# Patient Record
Sex: Male | Born: 1965 | Race: White | Hispanic: No | Marital: Single
Health system: Southern US, Community
[De-identification: ages and names within clinical notes are randomized; demographics above are authoritative.]

---

## 2004-07-29 ENCOUNTER — Emergency Department: Payer: Self-pay | Admitting: Emergency Medicine

## 2005-03-03 ENCOUNTER — Other Ambulatory Visit: Payer: Self-pay

## 2005-03-04 ENCOUNTER — Observation Stay: Payer: Self-pay | Admitting: Internal Medicine

## 2005-04-18 ENCOUNTER — Emergency Department: Payer: Self-pay | Admitting: Emergency Medicine

## 2005-09-29 ENCOUNTER — Emergency Department: Payer: Self-pay | Admitting: Emergency Medicine

## 2006-02-18 ENCOUNTER — Emergency Department: Payer: Self-pay | Admitting: Emergency Medicine

## 2006-05-27 ENCOUNTER — Emergency Department: Payer: Self-pay | Admitting: Emergency Medicine

## 2007-10-25 ENCOUNTER — Inpatient Hospital Stay: Payer: Self-pay | Admitting: Internal Medicine

## 2007-10-25 ENCOUNTER — Other Ambulatory Visit: Payer: Self-pay

## 2007-10-26 ENCOUNTER — Other Ambulatory Visit: Payer: Self-pay

## 2008-06-16 ENCOUNTER — Inpatient Hospital Stay: Payer: Self-pay | Admitting: Internal Medicine

## 2010-02-19 ENCOUNTER — Emergency Department: Payer: Self-pay | Admitting: Emergency Medicine

## 2010-06-11 ENCOUNTER — Emergency Department: Payer: Self-pay | Admitting: Emergency Medicine

## 2011-09-16 ENCOUNTER — Emergency Department: Payer: Self-pay | Admitting: Unknown Physician Specialty

## 2011-09-17 LAB — CBC
HCT: 36.2 % — ABNORMAL LOW (ref 40.0–52.0)
MCH: 34 pg (ref 26.0–34.0)
MCHC: 34.5 g/dL (ref 32.0–36.0)
MCV: 99 fL (ref 80–100)
RBC: 3.67 10*6/uL — ABNORMAL LOW (ref 4.40–5.90)

## 2011-09-17 LAB — COMPREHENSIVE METABOLIC PANEL
Alkaline Phosphatase: 49 U/L — ABNORMAL LOW (ref 50–136)
Anion Gap: 15 (ref 7–16)
BUN: 6 mg/dL — ABNORMAL LOW (ref 7–18)
Calcium, Total: 8 mg/dL — ABNORMAL LOW (ref 8.5–10.1)
EGFR (African American): >60
Glucose: 106 mg/dL — ABNORMAL HIGH (ref 65–99)
Potassium: 3.5 mmol/L (ref 3.5–5.1)
SGOT(AST): 28 U/L (ref 15–37)
SGPT (ALT): 22 U/L

## 2011-09-17 LAB — LIPASE, BLOOD: Lipase: 973 U/L — ABNORMAL HIGH (ref 73–393)

## 2011-09-17 LAB — RAPID INFLUENZA A&B ANTIGENS

## 2011-09-17 LAB — ETHANOL: Ethanol %: 0.341 % (ref 0.000–0.080)

## 2011-11-26 ENCOUNTER — Emergency Department: Payer: Self-pay | Admitting: *Deleted

## 2011-11-26 LAB — CBC WITH DIFFERENTIAL/PLATELET
Basophil %: 0.8 %
Eosinophil %: 0.4 %
HCT: 40 % (ref 40.0–52.0)
HGB: 13.3 g/dL (ref 13.0–18.0)
Lymphocyte #: 1.3 10*3/uL (ref 1.0–3.6)
Lymphocyte %: 20.9 %
MCHC: 33.3 g/dL (ref 32.0–36.0)
MCV: 103 fL — ABNORMAL HIGH (ref 80–100)
Monocyte %: 6.9 %
Neutrophil #: 4.5 10*3/uL (ref 1.4–6.5)
Neutrophil %: 71 %
RBC: 3.9 10*6/uL — ABNORMAL LOW (ref 4.40–5.90)
RDW: 15.2 % — ABNORMAL HIGH (ref 11.5–14.5)
WBC: 6.3 10*3/uL (ref 3.8–10.6)

## 2011-11-26 LAB — COMPREHENSIVE METABOLIC PANEL
Albumin: 4.1 g/dL (ref 3.4–5.0)
Alkaline Phosphatase: 64 U/L (ref 50–136)
Anion Gap: 12 (ref 7–16)
BUN: 5 mg/dL — ABNORMAL LOW (ref 7–18)
Bilirubin,Total: 0.5 mg/dL (ref 0.2–1.0)
Calcium, Total: 8.7 mg/dL (ref 8.5–10.1)
Chloride: 102 mmol/L (ref 98–107)
Co2: 24 mmol/L (ref 21–32)
EGFR (Non-African Amer.): >60
Glucose: 99 mg/dL (ref 65–99)
SGOT(AST): 99 U/L — ABNORMAL HIGH (ref 15–37)
Sodium: 138 mmol/L (ref 136–145)
Total Protein: 7.2 g/dL (ref 6.4–8.2)

## 2011-11-26 LAB — ETHANOL
Ethanol %: 0.414 % (ref 0.000–0.080)
Ethanol: 414 mg/dL

## 2011-11-27 LAB — ETHANOL: Ethanol %: 0.062 % (ref 0.000–0.080)

## 2012-10-02 ENCOUNTER — Emergency Department: Payer: Self-pay | Admitting: Emergency Medicine

## 2012-10-02 LAB — BASIC METABOLIC PANEL
Calcium, Total: 8.9 mg/dL (ref 8.5–10.1)
Chloride: 95 mmol/L — ABNORMAL LOW (ref 98–107)
Creatinine: 0.77 mg/dL (ref 0.60–1.30)
EGFR (African American): >60
EGFR (Non-African Amer.): >60
Glucose: 71 mg/dL (ref 65–99)
Osmolality: 256 (ref 275–301)

## 2012-10-02 LAB — CBC WITH DIFFERENTIAL/PLATELET
Basophil %: 0.2 %
Eosinophil %: 0.1 %
HCT: 35 % — ABNORMAL LOW (ref 40.0–52.0)
HGB: 12.2 g/dL — ABNORMAL LOW (ref 13.0–18.0)
MCH: 33.6 pg (ref 26.0–34.0)
MCHC: 34.7 g/dL (ref 32.0–36.0)
MCV: 97 fL (ref 80–100)
RDW: 16.1 % — ABNORMAL HIGH (ref 11.5–14.5)
WBC: 5.5 10*3/uL (ref 3.8–10.6)

## 2012-10-06 ENCOUNTER — Ambulatory Visit: Payer: Self-pay | Admitting: Unknown Physician Specialty

## 2012-10-06 LAB — URINALYSIS, COMPLETE
Bacteria: NONE SEEN
Bilirubin,UR: NEGATIVE
Glucose,UR: NEGATIVE mg/dL (ref 0–75)
Ph: 6 (ref 4.5–8.0)
Protein: NEGATIVE
RBC,UR: <1 /HPF (ref 0–5)
Squamous Epithelial: NONE SEEN
WBC UR: <1 /HPF (ref 0–5)

## 2012-10-06 LAB — SYNOVIAL CELL COUNT + DIFF, W/ CRYSTALS
Crystals, Joint Fluid: NONE SEEN
Lymphocytes: 5 %
Nucleated Cell Count: 54537 /mm3
Other Cells BF: 0 %
Other Mononuclear Cells: 2 %

## 2012-10-06 LAB — CBC
HCT: 31.9 % — ABNORMAL LOW (ref 40.0–52.0)
MCH: 34.5 pg — ABNORMAL HIGH (ref 26.0–34.0)
MCHC: 35.4 g/dL (ref 32.0–36.0)
RBC: 3.27 10*6/uL — ABNORMAL LOW (ref 4.40–5.90)
RDW: 15.8 % — ABNORMAL HIGH (ref 11.5–14.5)
WBC: 6.1 10*3/uL (ref 3.8–10.6)

## 2012-10-06 LAB — COMPREHENSIVE METABOLIC PANEL
Albumin: 2.7 g/dL — ABNORMAL LOW (ref 3.4–5.0)
Alkaline Phosphatase: 78 U/L (ref 50–136)
Anion Gap: 9 (ref 7–16)
Calcium, Total: 8.6 mg/dL (ref 8.5–10.1)
Chloride: 96 mmol/L — ABNORMAL LOW (ref 98–107)
EGFR (African American): >60
EGFR (Non-African Amer.): >60
Potassium: 3.6 mmol/L (ref 3.5–5.1)
SGOT(AST): 90 U/L — ABNORMAL HIGH (ref 15–37)

## 2012-10-07 ENCOUNTER — Inpatient Hospital Stay: Payer: Self-pay | Admitting: Internal Medicine

## 2012-10-07 LAB — CULTURE, BLOOD (SINGLE)

## 2012-10-08 LAB — CREATININE, SERUM: Creatinine: 0.91 mg/dL (ref 0.60–1.30)

## 2012-10-10 LAB — BODY FLUID CULTURE

## 2012-10-12 LAB — CULTURE, BLOOD (SINGLE)

## 2013-01-01 ENCOUNTER — Inpatient Hospital Stay: Payer: Self-pay | Admitting: Internal Medicine

## 2013-01-01 LAB — URINALYSIS, COMPLETE
Bilirubin,UR: NEGATIVE
Leukocyte Esterase: NEGATIVE
Nitrite: NEGATIVE
Ph: 7 (ref 4.5–8.0)
Protein: 100
RBC,UR: 5 /HPF (ref 0–5)
Specific Gravity: 1.014 (ref 1.003–1.030)
WBC UR: <1 /HPF (ref 0–5)

## 2013-01-01 LAB — CBC
HCT: 39 % — ABNORMAL LOW (ref 40.0–52.0)
MCH: 34.8 pg — ABNORMAL HIGH (ref 26.0–34.0)
MCHC: 34.4 g/dL (ref 32.0–36.0)
MCV: 101 fL — ABNORMAL HIGH (ref 80–100)
Platelet: 178 10*3/uL (ref 150–440)
RBC: 3.86 10*6/uL — ABNORMAL LOW (ref 4.40–5.90)
WBC: 7.3 10*3/uL (ref 3.8–10.6)

## 2013-01-01 LAB — ETHANOL
Ethanol %: <0.003 % (ref 0.000–0.080)
Ethanol: <3 mg/dL

## 2013-01-01 LAB — COMPREHENSIVE METABOLIC PANEL
Albumin: 4.3 g/dL (ref 3.4–5.0)
Alkaline Phosphatase: 72 U/L (ref 50–136)
BUN: 9 mg/dL (ref 7–18)
Chloride: 101 mmol/L (ref 98–107)
Co2: 20 mmol/L — ABNORMAL LOW (ref 21–32)
Creatinine: 1.64 mg/dL — ABNORMAL HIGH (ref 0.60–1.30)
EGFR (African American): 56 — ABNORMAL LOW
Osmolality: 281 (ref 275–301)
Potassium: 3.7 mmol/L (ref 3.5–5.1)
SGOT(AST): 91 U/L — ABNORMAL HIGH (ref 15–37)
SGPT (ALT): 75 U/L (ref 12–78)
Total Protein: 7.6 g/dL (ref 6.4–8.2)

## 2013-01-01 LAB — TROPONIN I: Troponin-I: <0.02 ng/mL

## 2013-01-02 LAB — CBC WITH DIFFERENTIAL/PLATELET
Basophil #: 0 10*3/uL (ref 0.0–0.1)
Lymphocyte #: 0.7 10*3/uL — ABNORMAL LOW (ref 1.0–3.6)
MCHC: 34.8 g/dL (ref 32.0–36.0)
MCV: 100 fL (ref 80–100)
Monocyte #: 0.4 x10 3/mm (ref 0.2–1.0)
Neutrophil %: 76.3 %
RBC: 3.54 10*6/uL — ABNORMAL LOW (ref 4.40–5.90)
RDW: 15.8 % — ABNORMAL HIGH (ref 11.5–14.5)

## 2013-01-02 LAB — BASIC METABOLIC PANEL
Anion Gap: 7 (ref 7–16)
BUN: 7 mg/dL (ref 7–18)
Calcium, Total: 8.3 mg/dL — ABNORMAL LOW (ref 8.5–10.1)
Chloride: 104 mmol/L (ref 98–107)
Co2: 26 mmol/L (ref 21–32)
Creatinine: 0.86 mg/dL (ref 0.60–1.30)
EGFR (African American): >60
EGFR (Non-African Amer.): >60
Osmolality: 271 (ref 275–301)
Potassium: 3.1 mmol/L — ABNORMAL LOW (ref 3.5–5.1)

## 2013-01-02 LAB — HEMOGLOBIN A1C: Hemoglobin A1C: 5.3 % (ref 4.2–6.3)

## 2013-03-21 ENCOUNTER — Emergency Department: Payer: Self-pay | Admitting: Internal Medicine

## 2013-03-21 LAB — COMPREHENSIVE METABOLIC PANEL
Albumin: 4.4 g/dL (ref 3.4–5.0)
BUN: 9 mg/dL (ref 7–18)
Calcium, Total: 9.6 mg/dL (ref 8.5–10.1)
Chloride: 99 mmol/L (ref 98–107)
Co2: 17 mmol/L — ABNORMAL LOW (ref 21–32)
Creatinine: 1.26 mg/dL (ref 0.60–1.30)
EGFR (African American): >60
EGFR (Non-African Amer.): >60
Glucose: 121 mg/dL — ABNORMAL HIGH (ref 65–99)
Osmolality: 270 (ref 275–301)
SGOT(AST): 42 U/L — ABNORMAL HIGH (ref 15–37)
Total Protein: 7.4 g/dL (ref 6.4–8.2)

## 2013-03-21 LAB — CBC
HCT: 40.9 % (ref 40.0–52.0)
HGB: 14.3 g/dL (ref 13.0–18.0)
Platelet: 237 10*3/uL (ref 150–440)
RBC: 4.09 10*6/uL — ABNORMAL LOW (ref 4.40–5.90)
WBC: 7 10*3/uL (ref 3.8–10.6)

## 2013-03-21 LAB — CK TOTAL AND CKMB (NOT AT ARMC): CK-MB: 3.7 ng/mL — ABNORMAL HIGH (ref 0.5–3.6)

## 2013-07-06 IMAGING — CT CT ABD-PELV W/ CM
1 of 2 series · 14 of 32 positions shown, 18 images · non-contrast
Comparison: none

REASON FOR EXAM: (1) swollen right leg / high ESR; (2) swollen R. leg-
r/o abscess / tumor obstru
COMMENTS:

[Series 2: 3mm soft tissue · axial · 0.67mm/px · z∈[-234,+186]mm · 14 of 160 slices shown, 18 images]
[im 13/160  soft-tissue]
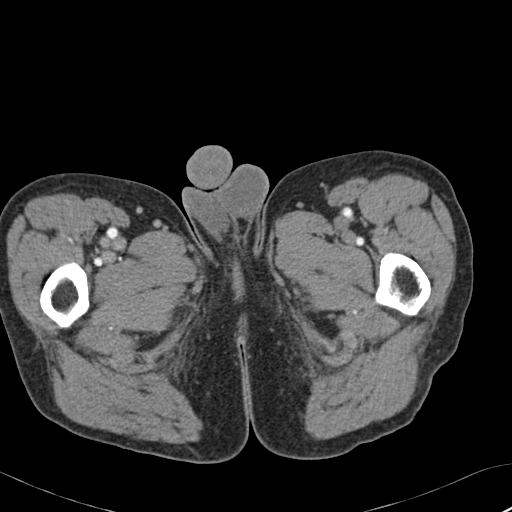
[im 13/160  bone]
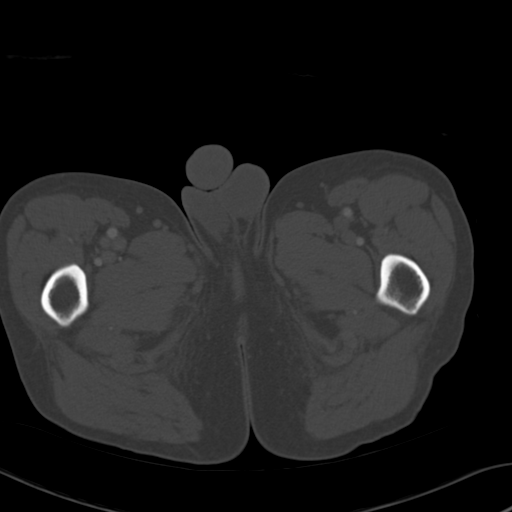
[im 26/160  soft-tissue]
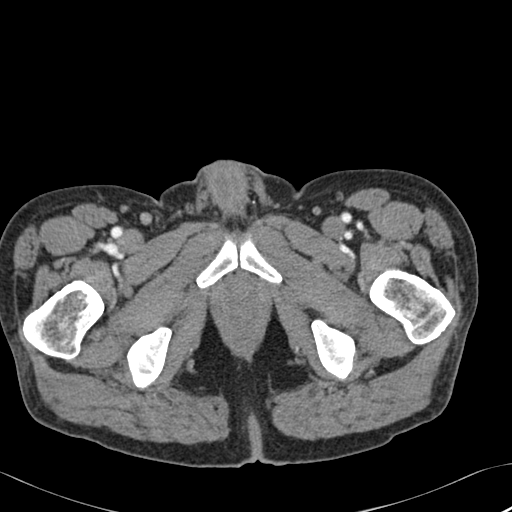
[im 39/160  soft-tissue]
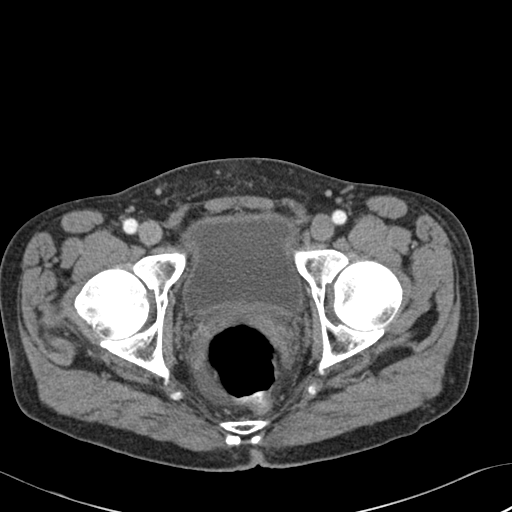
[im 51/160  soft-tissue]
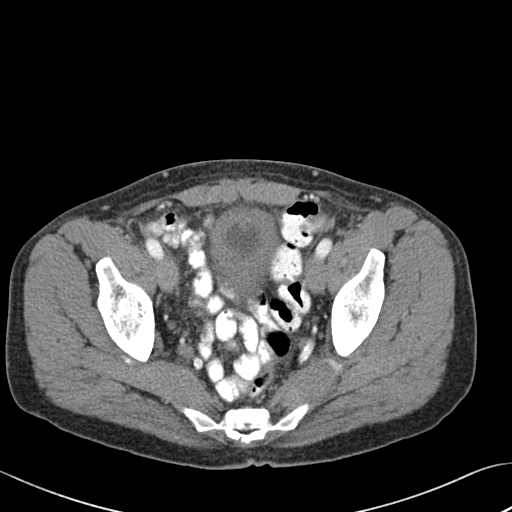
[im 64/160  soft-tissue]
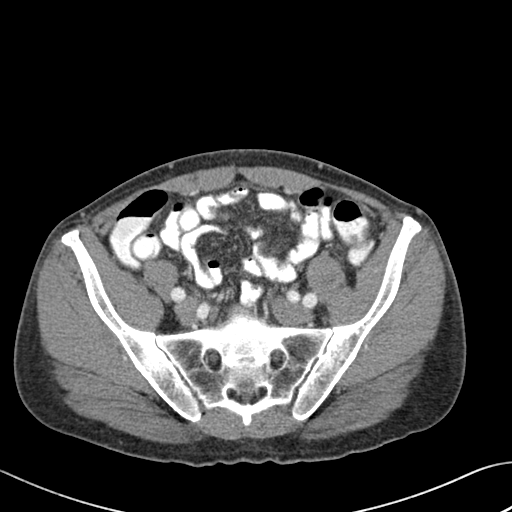
[im 77/160  soft-tissue]
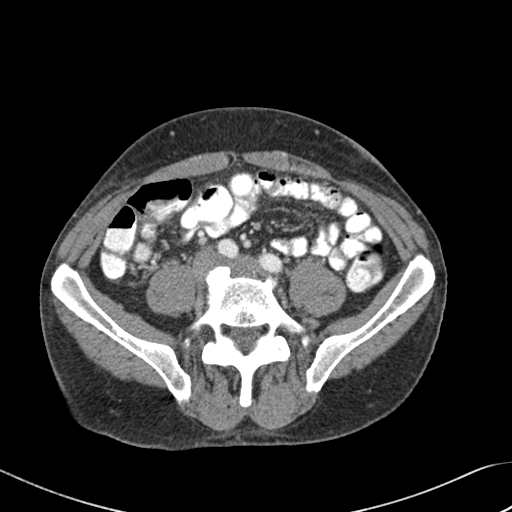
[im 90/160  soft-tissue]
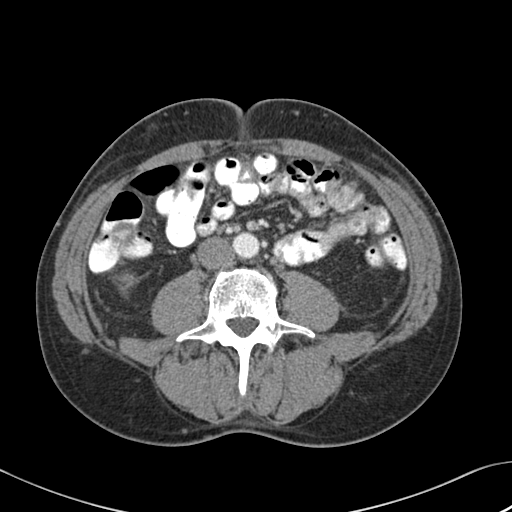
[im 102/160  soft-tissue]
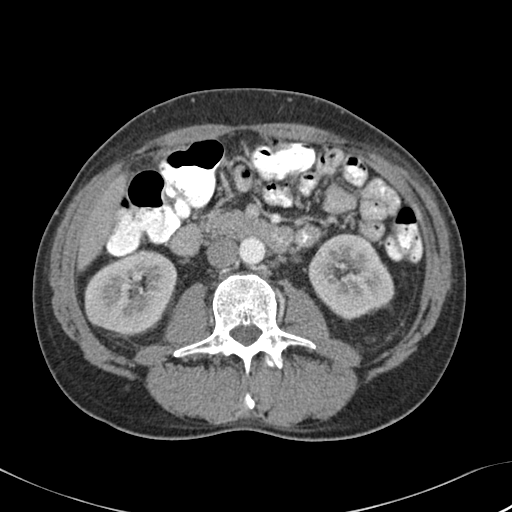
[im 115/160  soft-tissue]
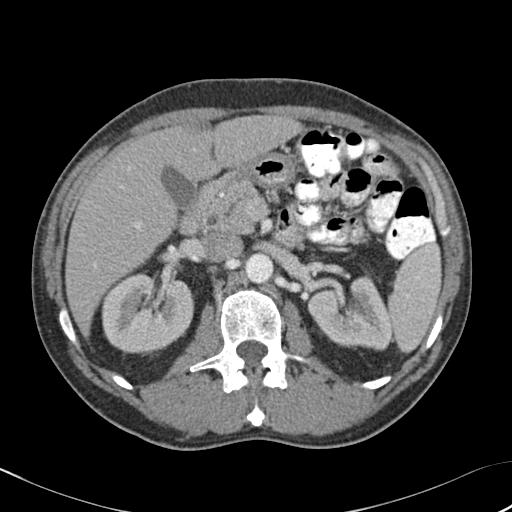
[im 115/160  bone]
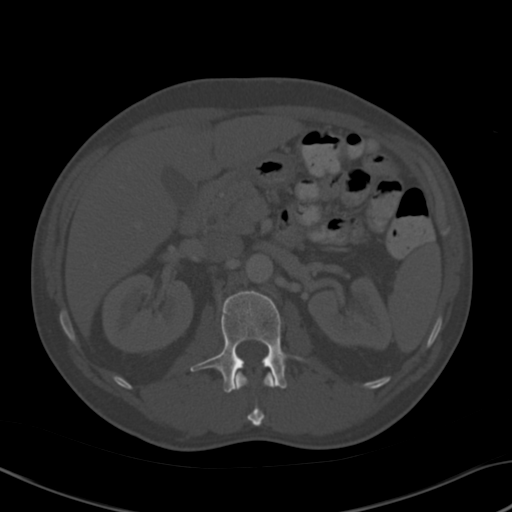
[im 128/160  soft-tissue]
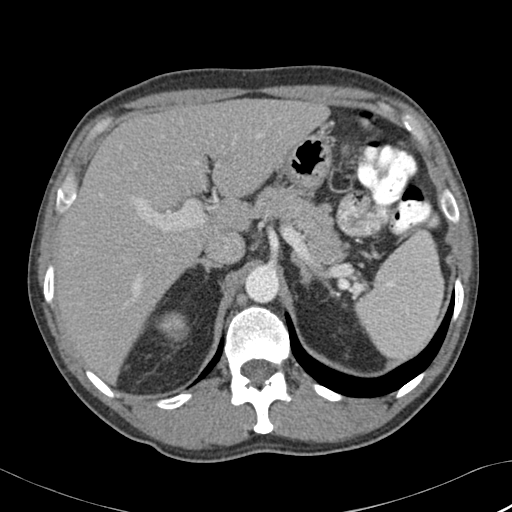
[im 134/160  lung]
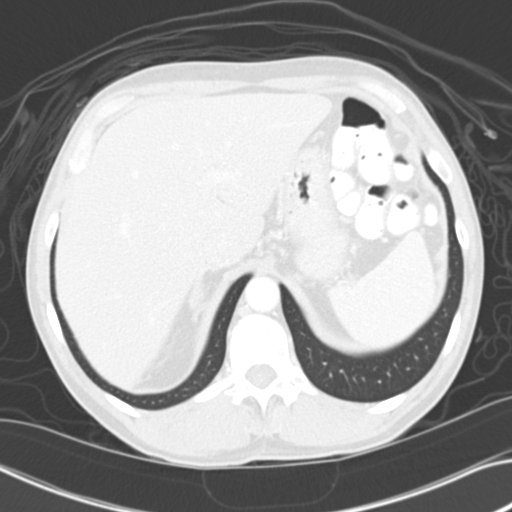
[im 140/160  soft-tissue]
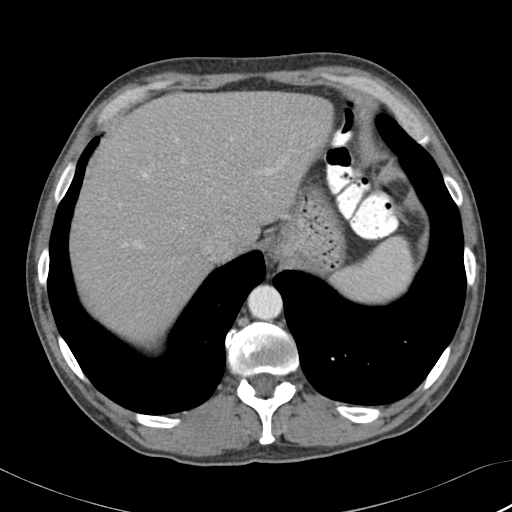
[im 140/160  lung]
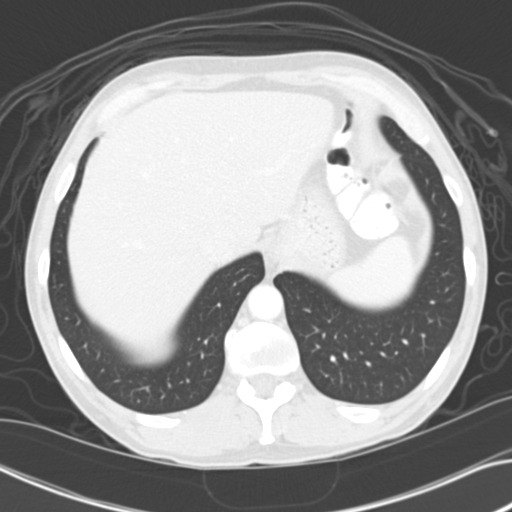
[im 147/160  lung]
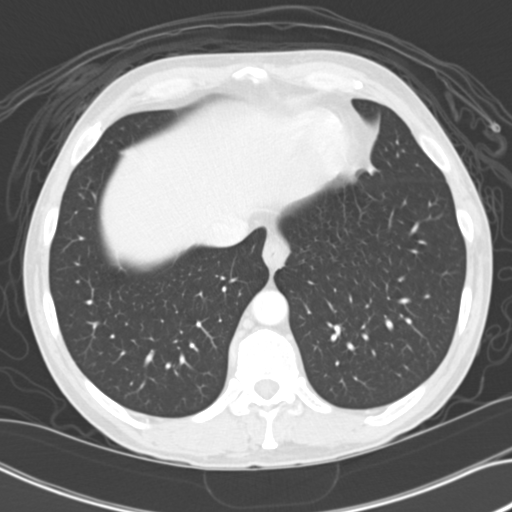
[im 153/160  soft-tissue]
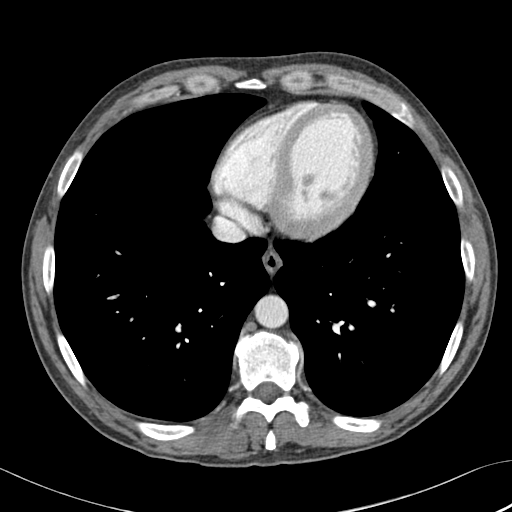
[im 153/160  lung]
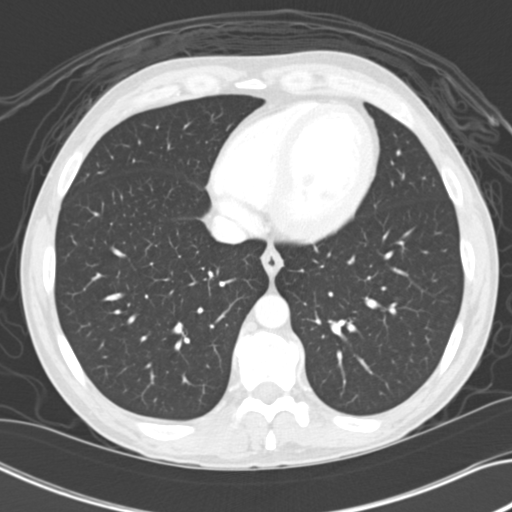

[14 of 32 positions shown; findings below may reference images not displayed]

PROCEDURE:     CT  - CT ABDOMEN / PELVIS  W  - October 08, 2012  [DATE]

RESULT:     Axial CT scanning was performed through the abdomen and pelvis
with reconstructions at 3 mm intervals and slice thicknesses following
intravenous administration of 100 cc of Osovue-BPX as well as administration
of oral contrast material. Review of multiplanar reconstructed images was
performed separately on the VIA monitor.

The liver exhibits normal density with no focal mass or ductal dilation. The
gallbladder is adequately distended and exhibits 2 tiny stones near the
neck. There is no evidence of gallbladder wall thickening or pericholecystic
fluid. The pancreas exhibits no focal mass. The pancreatic duct is visible
but not abnormally dilated. The spleen, nondistended stomach, adrenal
glands, and kidneys are normal in appearance. There is mildly increased
density within the retroperitoneal fat especially surrounding the kidneys
there is no objective evidence of acute pyelonephritis.

The caliber of the abdominal aorta is normal. The inferior vena cava does
not appear abnormally distended nor collapsed. There is no periaortic nor
pericaval lymphadenopathy. The psoas musculature is normal in appearance.
The partially distended urinary bladder exhibits wall thickening. The
prostate gland and seminal vesicles are normal in appearance.

The partially contrast-filled loops of small and large bowel exhibit no
evidence of ileus or obstruction or acute inflammation. Within the pelvis
there is a small amount of fluid demonstrated.

The lung bases are clear. The lumbar vertebral bodies are preserved in
height.
IMPRESSION: 1. There is a small amount of free fluid within the pelvis. There is
subjective mild thickening of the wall of the urinary bladder. The kidneys
enhance reasonably well with no pattern to suggest pyelonephritis. There is
mildly increased density in the retroperitoneal fat which may reflect the
patient's volume status. There is no discrete abscess. Are there clinical or
laboratory values that suggest a urinary tract infection.
2. There are tiny gallstones present without evidence of acute cholecystitis.
3. No acute bowel abnormality is demonstrated.
4. No retroperitoneal mass or lymphadenopathy or inflammatory process is
demonstrated.

[REDACTED]

## 2013-08-05 ENCOUNTER — Emergency Department: Payer: Self-pay | Admitting: Emergency Medicine

## 2013-08-05 LAB — CBC
HCT: 39 % — ABNORMAL LOW (ref 40.0–52.0)
HGB: 13.4 g/dL (ref 13.0–18.0)
MCH: 33.9 pg (ref 26.0–34.0)
MCHC: 34.4 g/dL (ref 32.0–36.0)
MCV: 98 fL (ref 80–100)
Platelet: 191 10*3/uL (ref 150–440)
RBC: 3.97 10*6/uL — AB (ref 4.40–5.90)
RDW: 13.5 % (ref 11.5–14.5)
WBC: 7.4 10*3/uL (ref 3.8–10.6)

## 2013-08-05 LAB — URINALYSIS, COMPLETE
Bacteria: NONE SEEN
Bilirubin,UR: NEGATIVE
Glucose,UR: NEGATIVE mg/dL (ref 0–75)
Ketone: NEGATIVE
Leukocyte Esterase: NEGATIVE
Nitrite: NEGATIVE
PH: 6 (ref 4.5–8.0)
Protein: NEGATIVE
Specific Gravity: 1.003 (ref 1.003–1.030)
Squamous Epithelial: NONE SEEN
WBC UR: NONE SEEN /HPF (ref 0–5)

## 2013-08-05 LAB — COMPREHENSIVE METABOLIC PANEL
ALBUMIN: 3.8 g/dL (ref 3.4–5.0)
ALK PHOS: 61 U/L
ALT: 22 U/L (ref 12–78)
ANION GAP: 7 (ref 7–16)
BUN: 5 mg/dL — AB (ref 7–18)
Bilirubin,Total: 0.3 mg/dL (ref 0.2–1.0)
CREATININE: 0.71 mg/dL (ref 0.60–1.30)
Calcium, Total: 8.5 mg/dL (ref 8.5–10.1)
Chloride: 102 mmol/L (ref 98–107)
Co2: 26 mmol/L (ref 21–32)
EGFR (African American): >60
EGFR (Non-African Amer.): >60
Glucose: 75 mg/dL (ref 65–99)
Osmolality: 266 (ref 275–301)
POTASSIUM: 3.2 mmol/L — AB (ref 3.5–5.1)
SGOT(AST): 31 U/L (ref 15–37)
Sodium: 135 mmol/L — ABNORMAL LOW (ref 136–145)
TOTAL PROTEIN: 6.8 g/dL (ref 6.4–8.2)

## 2013-08-05 LAB — LIPASE, BLOOD: LIPASE: 246 U/L (ref 73–393)

## 2013-08-05 LAB — ETHANOL
Ethanol %: 0.244 % — ABNORMAL HIGH (ref 0.000–0.080)
Ethanol: 244 mg/dL

## 2013-08-05 LAB — MAGNESIUM: MAGNESIUM: 1.5 mg/dL — AB

## 2013-08-05 LAB — TROPONIN I: Troponin-I: <0.02 ng/mL

## 2013-12-02 ENCOUNTER — Emergency Department: Payer: Self-pay | Admitting: Emergency Medicine

## 2014-01-14 ENCOUNTER — Emergency Department: Payer: Self-pay | Admitting: Emergency Medicine

## 2014-01-14 LAB — COMPREHENSIVE METABOLIC PANEL
AST: 74 U/L — AB (ref 15–37)
Albumin: 3.6 g/dL (ref 3.4–5.0)
Alkaline Phosphatase: 44 U/L — ABNORMAL LOW
Anion Gap: 12 (ref 7–16)
BILIRUBIN TOTAL: 0.4 mg/dL (ref 0.2–1.0)
BUN: 6 mg/dL — ABNORMAL LOW (ref 7–18)
CALCIUM: 8 mg/dL — AB (ref 8.5–10.1)
CO2: 22 mmol/L (ref 21–32)
Chloride: 99 mmol/L (ref 98–107)
Creatinine: 0.89 mg/dL (ref 0.60–1.30)
Glucose: 104 mg/dL — ABNORMAL HIGH (ref 65–99)
OSMOLALITY: 264 (ref 275–301)
Potassium: 3.7 mmol/L (ref 3.5–5.1)
SGPT (ALT): 69 U/L — ABNORMAL HIGH
Sodium: 133 mmol/L — ABNORMAL LOW (ref 136–145)
TOTAL PROTEIN: 6.8 g/dL (ref 6.4–8.2)

## 2014-01-14 LAB — URINALYSIS, COMPLETE
Bacteria: NEGATIVE
Bilirubin,UR: NEGATIVE
GLUCOSE, UR: NEGATIVE mg/dL (ref 0–75)
KETONE: NEGATIVE
Leukocyte Esterase: NEGATIVE
Nitrite: NEGATIVE
PROTEIN: NEGATIVE
Ph: 5 (ref 4.5–8.0)
RBC, UR: NONE SEEN /HPF (ref 0–5)
Specific Gravity: 1.005 (ref 1.003–1.030)
WBC UR: NONE SEEN /HPF (ref 0–5)

## 2014-01-14 LAB — ETHANOL
ETHANOL %: 0.248 % — AB (ref 0.000–0.080)
ETHANOL LVL: 248 mg/dL

## 2014-01-14 LAB — CBC
HCT: 38.1 % — ABNORMAL LOW (ref 40.0–52.0)
HGB: 12.8 g/dL — AB (ref 13.0–18.0)
MCH: 33.9 pg (ref 26.0–34.0)
MCHC: 33.5 g/dL (ref 32.0–36.0)
MCV: 101 fL — AB (ref 80–100)
PLATELETS: 168 10*3/uL (ref 150–440)
RBC: 3.78 10*6/uL — ABNORMAL LOW (ref 4.40–5.90)
RDW: 14.3 % (ref 11.5–14.5)
WBC: 4.3 10*3/uL (ref 3.8–10.6)

## 2014-01-14 LAB — LIPASE, BLOOD: Lipase: 326 U/L (ref 73–393)

## 2014-01-14 LAB — MAGNESIUM: MAGNESIUM: 1.5 mg/dL — AB

## 2014-01-14 LAB — TROPONIN I

## 2014-02-28 ENCOUNTER — Emergency Department: Payer: Self-pay | Admitting: Internal Medicine

## 2014-02-28 LAB — BASIC METABOLIC PANEL
ANION GAP: 8 (ref 7–16)
BUN: 7 mg/dL (ref 7–18)
CALCIUM: 9.1 mg/dL (ref 8.5–10.1)
CREATININE: 1.04 mg/dL (ref 0.60–1.30)
Chloride: 95 mmol/L — ABNORMAL LOW (ref 98–107)
Co2: 26 mmol/L (ref 21–32)
EGFR (African American): >60
GLUCOSE: 126 mg/dL — AB (ref 65–99)
Osmolality: 258 (ref 275–301)
Potassium: 3.5 mmol/L (ref 3.5–5.1)
Sodium: 129 mmol/L — ABNORMAL LOW (ref 136–145)

## 2014-02-28 LAB — DRUG SCREEN, URINE
Amphetamines, Ur Screen: NEGATIVE (ref ?–1000)
Barbiturates, Ur Screen: NEGATIVE (ref ?–200)
Benzodiazepine, Ur Scrn: NEGATIVE (ref ?–200)
Cannabinoid 50 Ng, Ur ~~LOC~~: NEGATIVE (ref ?–50)
Cocaine Metabolite,Ur ~~LOC~~: NEGATIVE (ref ?–300)
MDMA (ECSTASY) UR SCREEN: NEGATIVE (ref ?–500)
Methadone, Ur Screen: NEGATIVE (ref ?–300)
Opiate, Ur Screen: NEGATIVE (ref ?–300)
Phencyclidine (PCP) Ur S: NEGATIVE (ref ?–25)
TRICYCLIC, UR SCREEN: NEGATIVE (ref ?–1000)

## 2014-02-28 LAB — URINALYSIS, COMPLETE
Bacteria: NONE SEEN
Bilirubin,UR: NEGATIVE
LEUKOCYTE ESTERASE: NEGATIVE
NITRITE: NEGATIVE
Ph: 6 (ref 4.5–8.0)
RBC,UR: 24 /HPF (ref 0–5)
Specific Gravity: 1.016 (ref 1.003–1.030)
Squamous Epithelial: <1
WBC UR: 2 /HPF (ref 0–5)

## 2014-02-28 LAB — HEPATIC FUNCTION PANEL A (ARMC)
ALT: 51 U/L
Albumin: 4.2 g/dL (ref 3.4–5.0)
Alkaline Phosphatase: 59 U/L
BILIRUBIN TOTAL: 1.3 mg/dL — AB (ref 0.2–1.0)
Bilirubin, Direct: 0.5 mg/dL — ABNORMAL HIGH (ref 0.00–0.20)
SGOT(AST): 55 U/L — ABNORMAL HIGH (ref 15–37)
TOTAL PROTEIN: 7.3 g/dL (ref 6.4–8.2)

## 2014-02-28 LAB — CK TOTAL AND CKMB (NOT AT ARMC)
CK, TOTAL: 1350 U/L — AB
CK-MB: 22.2 ng/mL — ABNORMAL HIGH (ref 0.5–3.6)

## 2014-02-28 LAB — CBC
HCT: 40.1 % (ref 40.0–52.0)
HGB: 14 g/dL (ref 13.0–18.0)
MCH: 34.9 pg — ABNORMAL HIGH (ref 26.0–34.0)
MCHC: 35 g/dL (ref 32.0–36.0)
MCV: 100 fL (ref 80–100)
Platelet: 149 10*3/uL — ABNORMAL LOW (ref 150–440)
RBC: 4.02 10*6/uL — ABNORMAL LOW (ref 4.40–5.90)
RDW: 14.4 % (ref 11.5–14.5)
WBC: 6 10*3/uL (ref 3.8–10.6)

## 2014-02-28 LAB — TROPONIN I: TROPONIN-I: 0.02 ng/mL

## 2014-02-28 LAB — ETHANOL

## 2014-02-28 LAB — D-DIMER(ARMC): D-Dimer: 306 ng/ml

## 2014-10-10 NOTE — Discharge Summary (Signed)
PATIENT NAMPrecious [name]:  [name] MR#:  [id number] DATE OF BIRTH:  01/27/1966  DATE OF ADMISSION:  01/01/2013  DATE OF DISCHARGE:  01/02/2013  PRESENTING COMPLAINT:  Dizziness, shaking, and not feeling well.  DISCHARGE DIAGNOSES: 1.  Acute renal failure due to dehydration, improved.  2.  Heat exhaustion.  3.  Hypertension.  4.  GERD.  CODE STATUS:  FULL CODE.   MEDICATIONS: 1.  Prilosec 20 mg 1 p.o. daily.  2.  Metoprolol tartrate 25 mg b.i.d.   DIET:  Regular consistency.   FOLLOW UP:  With [facility] in 1 to 2 weeks.   LABS AT DISCHARGE: White count was 5.0, H and H is 12.3 and 35.4, platelet count is 119. Glucose is 86, BUN is 7, creatinine is 0.86, sodium is 137. Hemoglobin A1c is 5.3. Troponin is 0.03. Hepatitis C virus antibody is less than 0.01. UA negative for UTI. Creatinine on admission was 1.64. EKG shows sinus tachycardia.   HISTORY: [name] is a 49 year old Caucasian gentleman, who comes to the Emergency Room not feeling well, was admitted with:   1. Acute renal failure secondary to dehydration/prerenal azotemia. The patient was admitted, started on IV fluids. His creatinine was back to normal. He came in with creatinine of 1.64, came back to 0.8 prior to discharge. The patient was eating and drinking well.   2. Metabolic acidosis with lactic acidosis, likely secondary from heat exhaustion. The patient was working out in the heat. His symptoms improved after he received IV fluids in the hospital. His K was repleted.   3. Alcohol abuse with elevated liver function tests, scoring low on CIWA protocol. Advised alcohol abstinence.   Hospital stay otherwise remained stable. The patient remained a full code.   Time spent:  40 minutes.       ____________________________ Wylie HailSona A. Allena KatzPatel, MD sap:mr D: 01/07/2013 14:37:45 ET T: 01/07/2013 20:11:25 ET JOB#: [id number]  cc: Quinnlyn Hearns A. Allena KatzPatel, MD, <Dictator> [facility]  Willow OraSONA A Piya Mesch MD ELECTRONICALLY SIGNED  01/14/2013 11:24

## 2014-10-10 NOTE — H&P (Signed)
PATIENT NAMPrecious [name]:  [name], Jonathan [name] MR#:  [id number] DATE OF BIRTH:  September 15, 1965  DATE OF ADMISSION:  01/01/2013  PRIMARY CARE PHYSICIAN:  None.   CHIEF COMPLAINT:  Shaking and not feeling well.   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 They were getting ready to go home. He was getting into the truck. He started shaking real bad. He stated he never lost consciousness. No tongue bite. No loss of urine. He was brought in to the ER and was very tachycardic, found in acute renal failure and with a metabolic lactic acidosis. Hospitalist services were contacted for further evaluation. Initially there was some altered mental status and some shaking. He was given 2 mg of Ativan by the ER physician and seemed to improve. The patient currently feels fine and offers no complaints.   PAST MEDICAL HISTORY:  Inflammatory arthritis of the knee.   PAST SURGICAL HISTORY:  Surgery to the right foot, right wrist ganglion surgery, appendectomy.   ALLERGIES:  No known drug allergies.   MEDICATIONS INCLUDE:  Prilosec over the counter.   SOCIAL HISTORY:  No smoking. He does drink a 6-pack of beer per day. Last night he only had 2. No drug use.   FAMILY HISTORY:  Father died at age 50 after having a heart attack. Mother died in her 7350s of an unknown type of cancer.   REVIEW OF SYSTEMS:  No fever or chills or sweats. No weight loss. No weight gain. No weakness or fatigue.  EYES:  No blurry vision.  EARS, NOSE, MOUTH AND THROAT:  No hearing loss, no sore throat, no difficulty swallowing.  CARDIOVASCULAR:  No chest pain. No palpitation.  RESPIRATORY:  No shortness of breath. No sputum. No hemoptysis.  GASTROINTESTINAL:  No nausea, no vomiting, no abdominal pain, no diarrhea, no constipation, no bright red blood per rectum, no melena.  GENITOURINARY:  No burning on urination. No hematuria.  MUSCULOSKELETAL:  No joint pain or muscle  pain.  INTEGUMENT:  No rashes or eruptions.  NEUROLOGIC:  No fainting or blackouts.  PSYCHIATRIC:  No anxiety or depression.   PHYSICAL EXAMINATION: VITAL SIGNS:  Temperature 98.8, pulse 91, respirations 20, blood pressure 165/78, pulse oximetry 95% on room air.  GENERAL:  No respiratory distress.  EYES:  Conjunctivae and lids normal. Pupils equal, round and reactive to light. Extraocular muscles intact. No nystagmus.  EARS, NOSE, MOUTH AND THROAT:  Tympanic membrane:  No erythema. Nasal mucosa: No erythema.  THROAT:  No erythema, no exudate seen.  LIPS AND GUMS:  No lesions.  NECK:  No JVD. No bruits. No lymphadenopathy. No thyromegaly. No thyroid nodules palpated.  RESPIRATORY:  Lungs clear to auscultation. No use of accessory muscles to breathe. No rhonchi, rales or wheeze heard.  CARDIOVASCULAR:  S1, S2 normal. No gallops, rubs or murmurs heard. Carotid upstroke 2+ bilaterally. No bruits.  EXTREMITIES:  Dorsalis pedis pulses 2+ bilaterally. The patient is tachycardic on admission and no edema of the lower extremity.  ABDOMEN:  Soft, nontender. No organomegaly/splenomegaly. Normoactive bowel sounds. No masses felt.  LYMPHATIC:  No lymph nodes in the neck.  MUSCULOSKELETAL:  No clubbing, edema or cyanosis.  SKIN:  No rashes or ulcers seen.  NEUROLOGIC:  Cranial nerves II through XII are grossly intact. Deep tendon reflexes 2+ of bilateral lower extremities.  PSYCHIATRIC:  The patient is oriented to person, place and time.   LABORATORY AND RADIOLOGICAL  DATA:  Lactic acid 3.4.   URINALYSIS:  Blood 1+, trace ketones. CT scan of the head showed no acute intracranial abnormality. White blood cell count 7.3, H and H 13.4 and 39.0, platelet count of 178. Glucose 193, BUN 9, creatinine 1.64. Sodium 139, potassium 3.7, chloride 101, CO2 of 20, calcium 9.5. Liver function tests:  AST slightly elevated at 91. Anion gap 18. Troponin negative. Ethanol level less than 3.   EKG:  Sinus tachycardia at  122 beats per minute, no acute ST-T wave changes.   ASSESSMENT AND PLAN: 1.  Acute renal failure dehydration, probable heat stroke. We will give vigorous intravenous fluid hydration and recheck labs in the a.m.  2.  Metabolic acidosis with lactic acidosis, likely secondary from heat stroke. We will give intravenous fluid hydration and continue to monitor electrolytes and acid base status in the a.m.  3.  Alcohol abuse with elevated liver function tests. Will check a hepatitis C antibody. We will put on oral CIWA protocol and will give a dose of intravenous thiamine. Ethanol level is less than 3 mg/dL. We will watch for withdrawal with the CIWA protocol at this point. ER physician did give 2 mg of Ativan. The patient does not appear tremulous at this point.  4.  Impaired fasting glucose with a glucose of 193. We will put on a sliding scale for right now and check a hemoglobin A1c.   TIME SPENT ON ADMISSION:  Fifty-five minutes.    ____________________________ Herschell Dimes. Renae Gloss, MD rjw:NTS D: 01/01/2013 16:42:01 ET T: 01/01/2013 17:15:07 ET JOB#: [id number]  cc: Herschell Dimes. Renae Gloss, MD, <Dictator> Salley Scarlet MD ELECTRONICALLY SIGNED 01/04/2013 17:17

## 2014-10-10 NOTE — Consult Note (Signed)
PATIENT NAME:  [name], Jonathan L MR#:  [id number] DATE OF BIRTH:  [birth year]  DATE OF CONSULTATION:  10/07/2012  REQUESTING PHYSICIAN:  Emergency Room  CONSULTING PHYSICIAN:  Winn JockJames C. Saren Corkern, MD  REASON FOR CONSULTATION: A [age] year old male with right leg swelling and pain.   HISTORY OF PRESENT ILLNESS: The patient developed right leg swelling around April 14 when he presented to the Emergency Room on April 15 with swelling around the knee area. He had persistent pain. He went home, was placed on clindamycin, returned on 04/19 with persistent swelling. The knee was aspirated in the Emergency Room by the ER physician. I was consulted.   The patient denies any trauma to his knee. Prior Doppler ultrasound revealed negative for deep venous thrombosis. Did show a popliteal cyst.   He denies any other joint complaints; however, pain and swelling of that lower leg. He denies any fevers, chills, or constitutional symptoms.   PAST MEDICAL HISTORY: Unremarkable.   PAST SURGICAL HISTORY: Foot surgery, ganglion surgery, appendectomy.   FAMILY HISTORY: Notable for cardiac disease.   SOCIAL HABITS:  Nonsmoker, no alcohol abuse, social drinker, no drug abuse.   SOCIAL HISTORY: Married, unemployed.   ALLERGIES: PENICILLIN, WHICH CAUSES HIVES AND ITCHING.   REVIEW OF SYSTEMS: Unremarkable for any GI, GU, hematologic, dermatologic, psychiatric, neurologic problems. No shortness of breath. No rashes. Swelling in the leg as noted above. No psychiatric issues.   PHYSICAL EXAMINATION: GENERAL: Thin male. He is alert and oriented. He is cooperative. He is afebrile. Blood pressure is 150/85. Pulse is 90 and regular. Respirations are 18.  HEENT: Pupils are equal, round, and reactive to light and accommodation.  NECK: Supple.  LUNGS: Clear.  CARDIAC: Normal.  UPPER EXTREMITIES: Full range of motion with no tenderness and a normal neurovascular examination. Left lower extremity has full range of motion with no  tenderness and normal neurovascular examination.  RIGHT LOWER EXTREMITY: Full range of motion of the hip with no pain. There is some edema on the right foot. There is a mild effusion about the knee. There is no erythema. There is minimal calf tenderness. Range of motion of the knee is slightly decreased.   LABORATORY: Laboratory work is reviewed. White blood cell count is normal at 6100. Sedimentation rate has increased from 70 to 86. Synovial fluid revealed 54,000 nucleated cells. Gram stain revealed no organisms. Blood cultures from April 15 were no growth.   X-rays are basically unremarkable.   IMPRESSION: Right leg swelling. Pulses are intact. The knee fluid k most likely a reactive fluid. There were no organisms seen. Doppler again showed no deep venous thrombosis.   OTHER ISSUES: Hyponatremia, hypoalbuminemia, elevated AST liver enzymes, elevated sedimentation rate, normocytic anemia.   PLAN: IV antibiotics. Follow knee culture. Activity as tolerated. Consider further work-up of vascular swelling in the lower leg.   ____________________________ Winn JockJames C. Gerrit Heckaliff, MD jcc:aw D: 10/07/2012 22:23:36 ET T: 10/08/2012 05:19:20 ET JOB#: [id number]  cc: Winn JockJames C. Gerrit Heckaliff, MD, <Dictator> Winn JockJAMES C Roseana Rhine MD ELECTRONICALLY SIGNED 11/06/2012 17:38

## 2014-10-10 NOTE — Consult Note (Signed)
Chief Complaint:  Subjective/Chief Complaint Right leg swelling, knee pain   VITAL SIGNS/ANCILLARY NOTES: **Vital Signs.:   20-Apr-14 22:02  Vital Signs Type Routine  Temperature Temperature (F) 98.1  Celsius 36.7  Temperature Source oral  Pulse Pulse 70  Respirations Respirations 18  Systolic BP Systolic BP 143  Diastolic BP (mmHg) Diastolic BP (mmHg) 79  Mean BP 100  Pulse Ox % Pulse Ox % 100  Oxygen Delivery Room Air/ 21 %   Brief Assessment:  Additional Physical Exam Knee: small effusion. Minimal tenderness. Less swelling in lower leg NV intact   Lab Results: Routine Micro:  19-Apr-14 20:49   Micro Text Report BLOOD CULTURE   COMMENT                   NO GROWTH IN 8-12 HOURS   ANTIBIOTIC                       Culture Comment NO GROWTH IN 8-12 HOURS  Result(s) reported on 07 Oct 2012 at 06:58AM.   Assessment/Plan:  Invasive Device Daily Assessment of Necessity:  Does the patient currently have any of the following indwelling devices? none   Assessment/Plan:  Assessment Right leg swelling, overall improving. Agree with workup plans Culture nmegative so far   Plan Continue IV antibiotics Continue workup as planned   Electronic Signatures: Celesta Gentilealiff, Bijal Siglin C (MD)  (Signed 20-Apr-14 22:17)  Authored: Chief Complaint, VITAL SIGNS/ANCILLARY NOTES, Brief Assessment, Lab Results, Assessment/Plan   Last Updated: 20-Apr-14 22:17 by Celesta Gentilealiff, Kasiah Manka C (MD)

## 2014-10-10 NOTE — Consult Note (Signed)
Brief Consult Note: Diagnosis: Persistent right leg pain.   Patient was seen by consultant.   Comments: Cellulitis, Knee fluid WBC 54K. Does not have clinical exam of infected joint. Rec: Medical admit for Iv antibiotics I will folow in hospital Gm stain pending..  Electronic Signatures: Celesta Gentilealiff, Romond Pipkins C (MD)  (Signed 19-Apr-14 23:36)  Authored: Brief Consult Note   Last Updated: 19-Apr-14 23:36 by Celesta Gentilealiff, Barack Nicodemus C (MD)

## 2014-10-10 NOTE — H&P (Signed)
PATIENT NAME:  [name], [name] MR#:  [id number] DATE OF BIRTH:  1966-01-13  DATE OF ADMISSION:  10/07/2012  PRIMARY CARE PHYSICIAN:  He has no doctor.   REFERRING PHYSICIAN:  Conni Slipper, M.D.   CHIEF COMPLAINT:  Right leg swelling.   HISTORY OF PRESENT ILLNESS:  The patient is a 49 year old Caucasian male with an unremarkable past medical history.  He was in his usual state of health until about a few days ago when he came on April 15th to the Emergency Department with swelling and some increased warmth around the right knee area, seen at the Emergency Department.  He had right knee aspiration which showed 54,000 nucleated cells, 93 were neutrophils, 5 were lymphocytes.  The patient thought to have cellulitis, advised to be admitted and treated, however patient went home to come back today with worsening right leg swelling.  This right now is involving the entire leg from the foot up to the upper part of the right thigh.  There is increase in the warmth, but there is not much redness.  His ESR is elevated 86, a few days ago was 70.  The patient also had Doppler ultrasound of the right leg during his first visit to the Emergency Department and reported to be negative for deep vein thrombosis.  The patient is now in the process to be admitted to the hospital for further evaluation and treatment.  Of note, the Emergency Department physician had consulted Dr. Albesa Seen to see the patient and examine the right knee and look at the results of the knee tap results.  He indicated that he felt this is nothing to do with the knee and the aspirated fluid appears to be reactive and no evidence of infection.   REVIEW OF SYSTEMS:  CONSTITUTIONAL:  Denies any fever.  No chills.  No fatigue.  EYES:  No blurring of vision.  No double vision.  EARS, NOSE, THROAT:  No hearing impairment.  No sore throat.  No dysphagia.  CARDIOVASCULAR:  No chest pain.  No shortness of breath.  No palpitations.  No syncope.   RESPIRATORY:  No shortness of breath.  No cough.  No chest pain.  No hemoptysis.  GASTROINTESTINAL:  No abdominal pain.  No vomiting.  No diarrhea.  GENITOURINARY:  No dysuria.  No frequency of urination.  MUSCULOSKELETAL:  No joint pain or swelling other than the right leg swelling.  No muscular pain or swelling again with the exception of the right leg problem.  INTEGUMENTARY:  No skin rash.  No ulcers.  NEUROLOGY:  No focal weakness.  No seizure activity.  No headache.  PSYCHIATRY:  No anxiety.  No depression.  ENDOCRINE:  No polyuria or polydipsia.  No heat or cold intolerance.   PAST MEDICAL HISTORY:  No significant past medical history.   PAST SURGICAL HISTORY:  Surgery to the right foot, right wrist ganglion surgery, appendectomy.  Also during childhood he had splinter removed from the right buttock area.   FAMILY HISTORY:  His father died at the age of 34 after having heart attack.  His mother died in her 56s with cancer.  The patient does not know what type of cancer.   SOCIAL HABITS:  Nonsmoker.  No history of alcohol abuse except for social drinking.  No other drug abuse.   SOCIAL HISTORY:  He is single, unemployed.   ADMISSION MEDICATIONS:  None.   ALLERGIES:  PENICILLIN CAUSING HIVES AND ITCHING.   PHYSICAL EXAMINATION: VITAL SIGNS:  Blood pressure 163/88, respiratory rate 18, pulse 96, temperature 98.5, oxygen saturation 98%.  GENERAL APPEARANCE:  Young male lying in bed in no acute distress.  HEAD AND NECK:  No pallor.  No icterus.  No cyanosis.  EARS, NOSE, THROAT:  Ear examination revealed normal hearing, no discharge, no lesions.  Examination of the nose showed no ulcers, no discharge, no bleeding.  Examination of the oropharyngeal area showed normal lips and tongue, no oral thrush.   EYES:  Revealed normal eyelids and conjunctivae.  Pupils about 5 to 6 mm, equal and reactive to light.  NECK:  Supple.  Trachea at midline.  No thyromegaly.  No masses.  HEART:   Revealed normal S1, S2.  No S3, S4.  No murmur.  No gallop.  No carotid bruits.  RESPIRATORY:  Revealed normal breathing pattern without use of accessory muscles.  No rales.  No wheezing.  ABDOMEN:  Soft without tenderness.  No hepatosplenomegaly.  No masses.  No hernias.  SKIN:  Revealed no ulcers.  No subcutaneous nodules.  The right leg is swollen with increased warmth.  MUSCULOSKELETAL:  No joint swelling or clubbing.  Again, the right leg is swollen, edematous, +2 pedal edema extending from the foot involving the dorsum of the foot and the ankle, up to the upper part of the thigh.  The right leg is more swollen than the left.  No calf tenderness or palpable cords.  NEUROLOGIC:  Cranial nerves II through XII are intact.  No focal motor deficit.  PSYCHIATRIC:  The patient is alert and oriented x 3.  Mood and affect were normal.   LABORATORY AND RADIOLOGIC DATA:  Doppler ultrasound of right leg done on April 15 was negative for deep venous thrombosis.  Serum glucose 100, BUN 5, creatinine 0.5, sodium 130.  His sodium a few days ago was 129.  Potassium 3.6.  Calcium 8.6.  Albumin is low at 2.7, total protein 7.  Bilirubin 0.4, AST elevated at 90, ALT normal at 57.  CBC showed a normal white count 6100, hemoglobin 11, hematocrit 31, platelet count 325.  MCV was normal 98.  ESR or erythrocyte sedimentation rate was 70 a few days ago, now it is 41.  Synovial fluid from the right knee joint showed 54,000 nucleated cells, 93 were neutrophils, 5 were lymphocytes.  Urinalysis was unremarkable except for +2 blood.  However, the RBC examination revealed less than 1 RBC, less than 1 white blood cell.  Blood cultures x 2 showed no growth, that was done on April 15th.   ASSESSMENT: 1.  Right leg swelling and edema involving the entire leg from the upper thigh to the distal right foot with preserved distal pulses.  The dorsalis pedis was +3.  I do not think that this represents cellulitis, although I am not 100% sure  there is an element of cellulitis.  There is no redness and no criteria for cellulitis other than increased warmth.  Rather, needs to look further for impeding blood flow or obstruction to the blood flow from the right leg.  Deep vein thrombosis cannot be excluded entirely with the Doppler ultrasound.  2.  Hyponatremia.  3.  Hypoalbuminemia.  4.  Elevated AST, liver enzymes.  5.  Normocytic anemia.  6.  Elevated ESR.   PLAN:  We will admit the patient to the medical floor.  I will continue antibiotics started by the Emergency Department physician until I have certainty that there is no infection.  Vancomycin and Rocephin were  used.  I will obtain CT scan of the abdomen and pelvis looking for a tumor or abscess or deep vein thrombosis higher in the pelvis and abdomen.  I will send viral hepatitis screen for A, B, and C.  If all these findings are negative, the patient may benefit from either a repeat Doppler ultrasound or proceeding to venogram to exclude deep vein thrombosis.   Time spent in evaluating this patient took more than 55 minutes.      ____________________________ Clovis Pu. Lenore Manner, MD amd:ea D: 10/07/2012 01:13:22 ET T: 10/07/2012 01:52:19 ET JOB#: [id number]  cc: Clovis Pu. Lenore Manner, MD, <Dictator> Ellin Saba MD ELECTRONICALLY SIGNED 10/08/2012 3:54

## 2014-10-10 NOTE — Discharge Summary (Signed)
PATIENT NAME:  [name], [name] MR#:  [id number] DATE OF BIRTH:  09/05/1965  DATE OF ADMISSION:  10/07/2012 DATE OF DISCHARGE:  10/08/2012  PRESENTING COMPLAINT: Right knee pain.   DISCHARGE DIAGNOSES: 1.  Right knee pain, swelling with effusion, status post arthrocentesis appears; to be from inflammatory arthritis 2.  Gastroesophageal reflux disease.   PROCEDURES: Right knee arthrocentesis.   MEDICATIONS: 1.  Ranitidine 75 mg daily.  2.  Percocet 5/325, 1 every 6 hours as needed.   FOLLOWUP: With Dr. Menz in 4 weeks.   LABS ON DISCHARGE: Creatinine is 0.91.   CT of the abdomen and pelvis with contrast showed tiny gallstones present without evidence of  acute cholecystitis. No acute gout. Bowel abnormality not demonstrated. No retroperitoneal mass or lymphadenopathy. There was a small amount of free fluid in the pelvis. There is mildly increased density in the retroperitoneal fat which may reflect patient's volume status.   Hepatitis profile is negative. Blood cultures negative in 36 hours.   Synovial fluid with nucleated cell count 54,000, with 93% neutrophils. No crystals seen.   Body fluid culture: No growth in 48 hours. Many white cells seen. No organisms seen.   Urine negative for UTI. White cell count is 6.1, H and H is 11.3 and 31.9. ESR is 86.   Ultrasound of the right lower extremity shows no deep vein thrombosis.   Uric acid is 5.9.   HOSPITAL CONSULTATIONS: Dr. Califf and Dr. Menz.   BRIEF SUMMARY OF HOSPITAL COURSE:  1.  The patient is a 49-year-old Caucasian gentleman who comes to the Emergency Room after he started noticing increasing right knee swelling and pain. He was admitted with right knee swelling and effusion which was suspected due to inflammatory arthritis. Extensive workup was done which was negative for any evidence of septic arthritis. He remained afebrile. White cell count was normal. Cultures from the right knee effusion remained negative. He was started  empirically on vancomycin, which was discontinued. The patient's already had finished a 1-week course of Cleocin, which he was given through the Emergency Room. His hepatitis panel was negative. ESR was mildly elevated.  2.  Hypoalbuminemia: Secondary to history of alcohol abuse.  3. GERD: Continued ranitidine.   Hospital stay otherwise remained stable. The patient was able to ambulate around by himself, and follow up with Dr. Menz in 2 to 4 weeks as needed.   Time spent: Forty minutes.    ____________________________ Sona A. Patel, MD sap:dm D: 10/09/2012 07:06:14 ET T: 10/09/2012 07:44:26 ET JOB#: [id number]  cc: Sona A. Patel, MD, <Dictator> Michael J. Menz, MD James C. Califf, MD SONA A PATEL MD ELECTRONICALLY SIGNED 10/10/2012 14:35 

## 2014-10-12 IMAGING — CT CT HEAD WITHOUT CONTRAST
2 series · 14 of 30 positions shown, 16 images · non-contrast
Comparison: 01/01/2013.

CLINICAL DATA: Syncope versus seizure.

EXAM:
CT HEAD WITHOUT CONTRAST
TECHNIQUE: Contiguous axial images were obtained from the base of the skull
through the vertex without intravenous contrast.

[Series 2: head wo · axial · 0.41mm/px · z∈[-36,+64]mm · 6 of 29 slices shown, 8 images]
[im 5/29  brain]
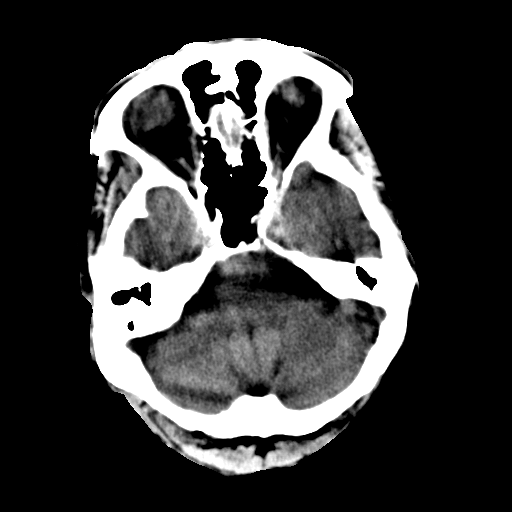
[im 5/29  bone]
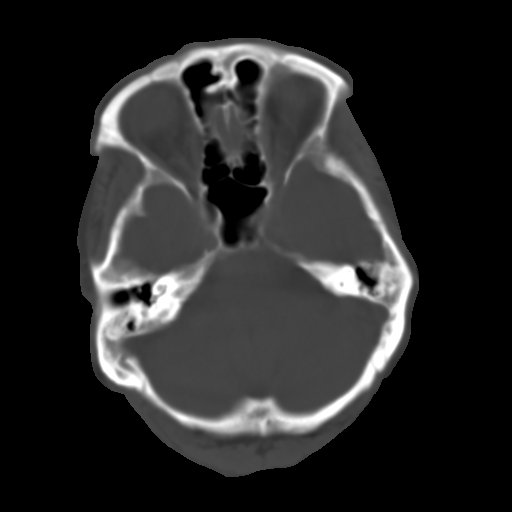
[im 9/29  brain]
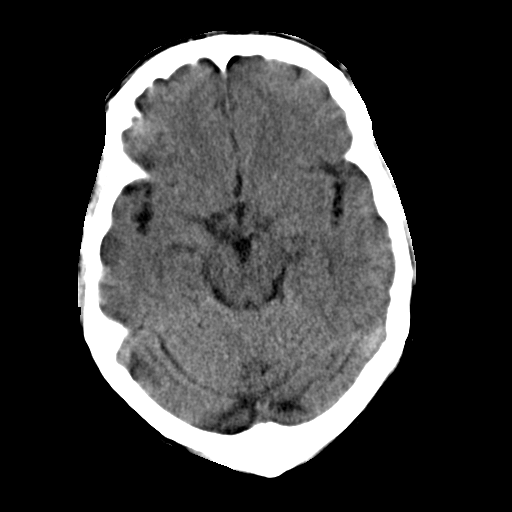
[im 13/29  brain]
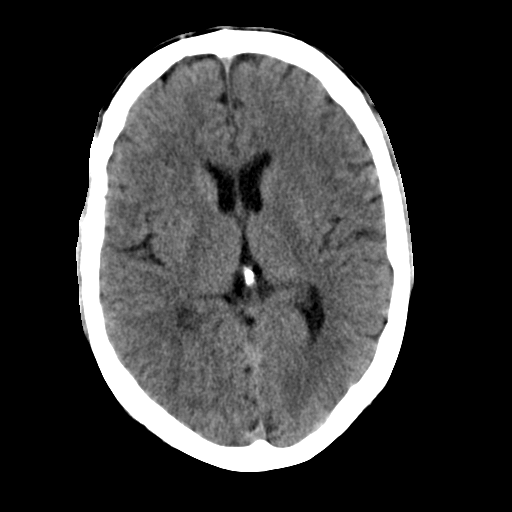
[im 17/29  brain]
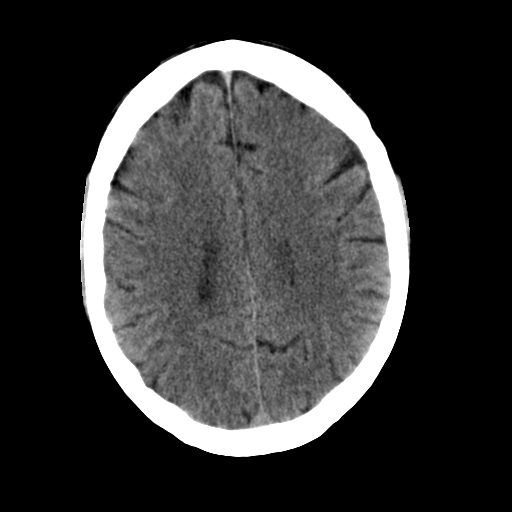
[im 21/29  brain]
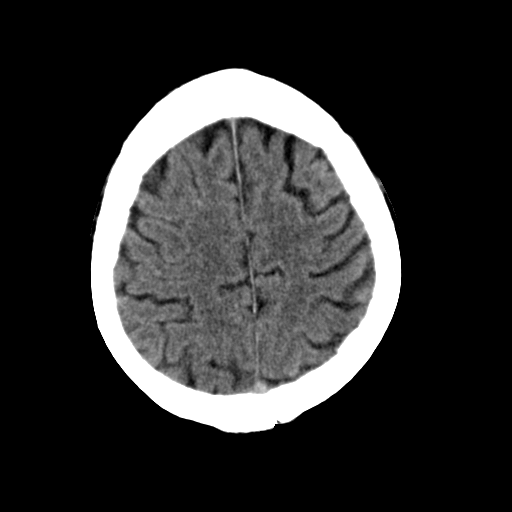
[im 21/29  bone]
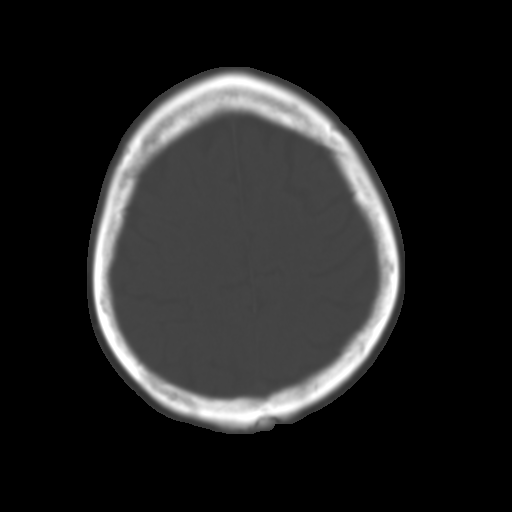
[im 25/29  brain]
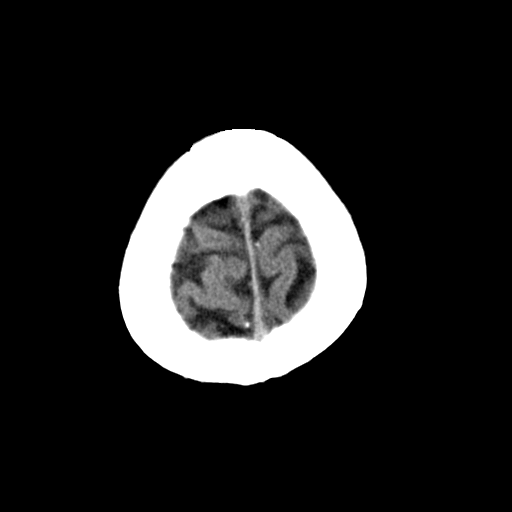

[Series 3: head bone · axial · 0.41mm/px · z∈[-52,+78]mm · 8 of 81 slices shown]
[im 8/81  bone]
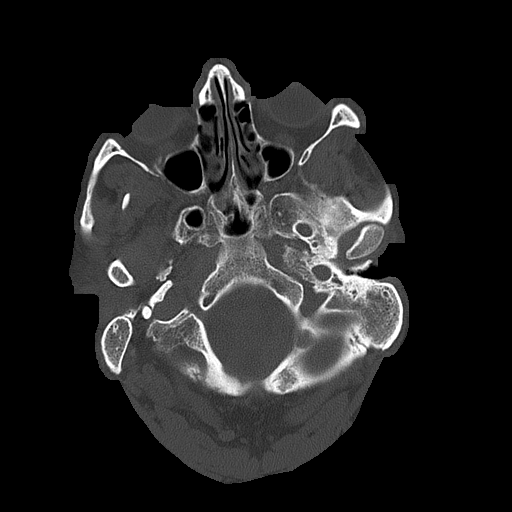
[im 16/81  bone]
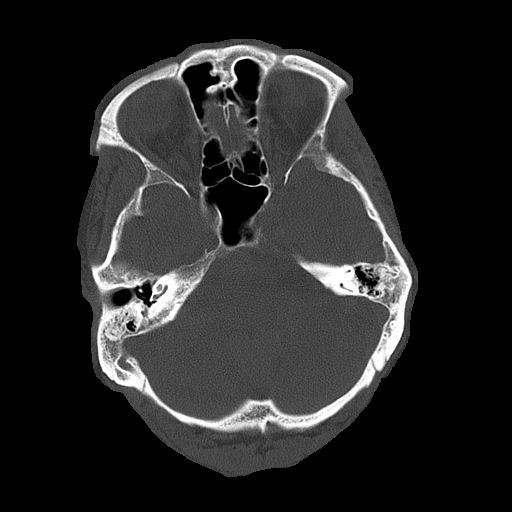
[im 27/81  bone]
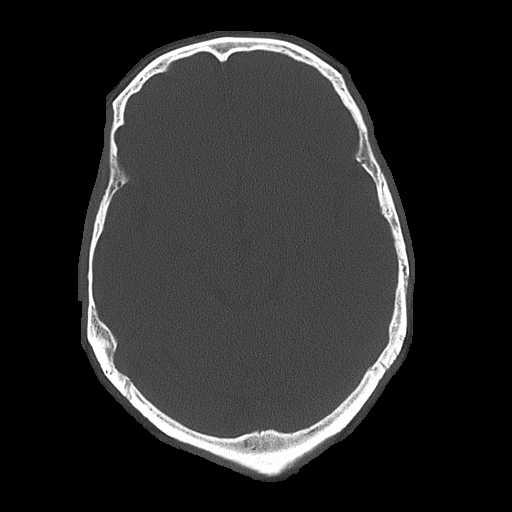
[im 35/81  bone]
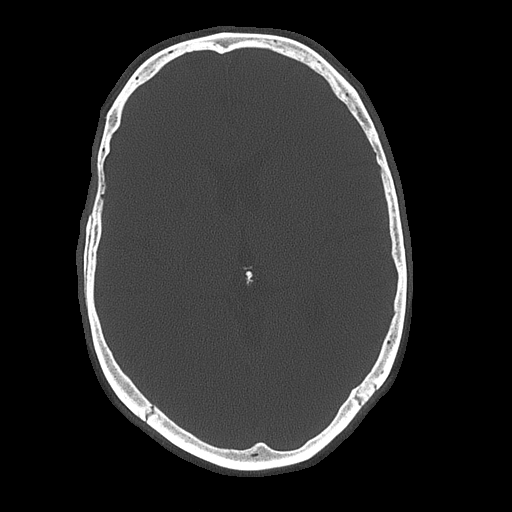
[im 46/81  bone]
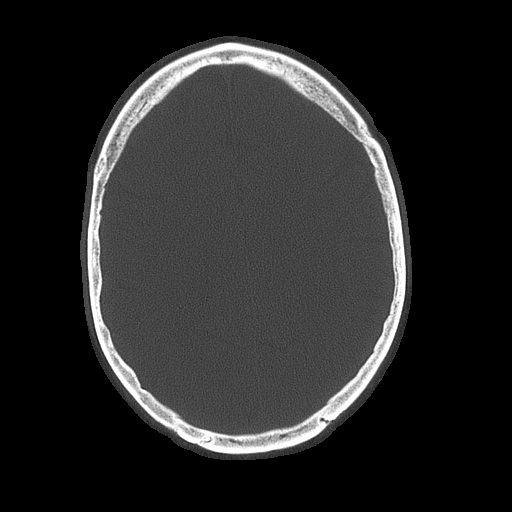
[im 54/81  bone]
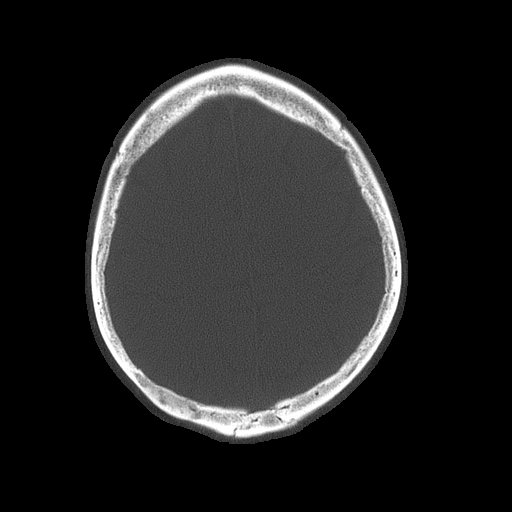
[im 65/81  bone]
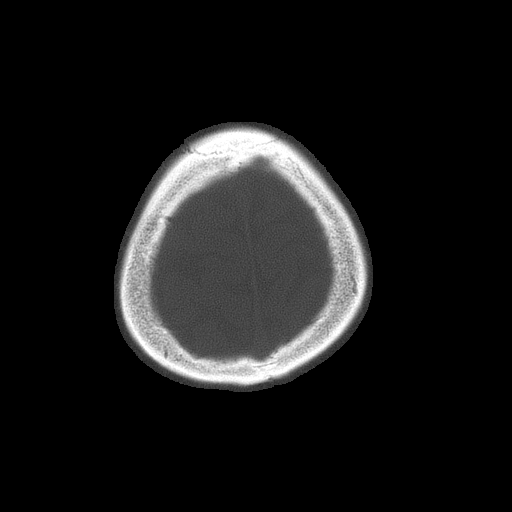
[im 73/81  bone]
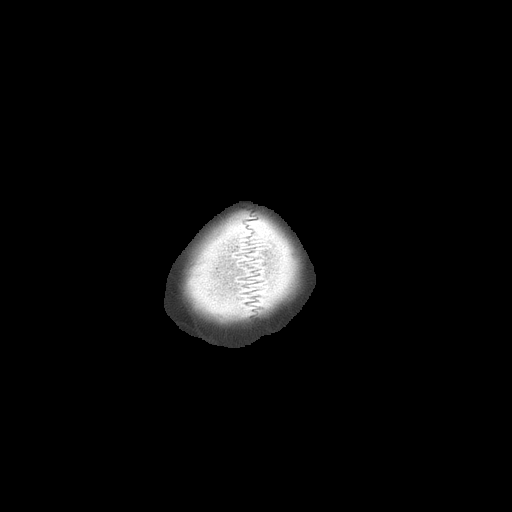

[14 of 30 positions shown; findings below may reference images not displayed]

FINDINGS: No mass lesion, mass effect, midline shift, hydrocephalus,
hemorrhage. No territorial ischemia or acute infarction. Paranasal
sinuses appear within normal limits.
IMPRESSION: Negative CT head.

## 2015-02-19 DEATH — deceased
